# Patient Record
Sex: Male | Born: 1980
Health system: Southern US, Community
[De-identification: ages and names within clinical notes are randomized; demographics above are authoritative.]

## PROBLEM LIST (undated history)

## (undated) DIAGNOSIS — I10 Essential (primary) hypertension: Secondary | ICD-10-CM

## (undated) DIAGNOSIS — E079 Disorder of thyroid, unspecified: Secondary | ICD-10-CM

## (undated) HISTORY — PX: APPENDECTOMY: SHX54

---

## 2002-01-07 ENCOUNTER — Emergency Department (HOSPITAL_COMMUNITY): Admission: EM | Admit: 2002-01-07 | Discharge: 2002-01-07 | Payer: Self-pay | Admitting: *Deleted

## 2003-06-29 ENCOUNTER — Emergency Department (HOSPITAL_COMMUNITY): Admission: EM | Admit: 2003-06-29 | Discharge: 2003-06-29 | Payer: Self-pay | Admitting: Internal Medicine

## 2004-11-03 ENCOUNTER — Emergency Department (HOSPITAL_COMMUNITY): Admission: EM | Admit: 2004-11-03 | Discharge: 2004-11-03 | Payer: Self-pay | Admitting: *Deleted

## 2004-11-04 ENCOUNTER — Emergency Department (HOSPITAL_COMMUNITY): Admission: EM | Admit: 2004-11-04 | Discharge: 2004-11-04 | Payer: Self-pay | Admitting: Emergency Medicine

## 2005-01-25 ENCOUNTER — Emergency Department (HOSPITAL_COMMUNITY): Admission: EM | Admit: 2005-01-25 | Discharge: 2005-01-25 | Payer: Self-pay | Admitting: Emergency Medicine

## 2006-11-03 ENCOUNTER — Emergency Department (HOSPITAL_COMMUNITY): Admission: EM | Admit: 2006-11-03 | Discharge: 2006-11-04 | Payer: Self-pay | Admitting: Emergency Medicine

## 2006-11-14 ENCOUNTER — Emergency Department: Payer: Self-pay | Admitting: Internal Medicine

## 2007-05-16 ENCOUNTER — Emergency Department (HOSPITAL_COMMUNITY): Admission: EM | Admit: 2007-05-16 | Discharge: 2007-05-16 | Payer: Self-pay | Admitting: Emergency Medicine

## 2008-06-22 ENCOUNTER — Emergency Department (HOSPITAL_COMMUNITY): Admission: EM | Admit: 2008-06-22 | Discharge: 2008-06-22 | Payer: Self-pay | Admitting: Emergency Medicine

## 2009-02-14 ENCOUNTER — Ambulatory Visit: Payer: Self-pay | Admitting: Cardiology

## 2009-09-18 ENCOUNTER — Emergency Department (HOSPITAL_COMMUNITY): Admission: EM | Admit: 2009-09-18 | Discharge: 2009-09-18 | Payer: Self-pay | Admitting: Emergency Medicine

## 2009-11-01 ENCOUNTER — Ambulatory Visit (HOSPITAL_COMMUNITY): Admission: RE | Admit: 2009-11-01 | Discharge: 2009-11-01 | Payer: Self-pay | Admitting: Preventative Medicine

## 2010-07-14 IMAGING — CR DG FOOT COMPLETE 3+V*R*
4 series · 4 of 4 positions shown · non-contrast
Comparison: None

CLINICAL DATA: Right foot injury, pain.

RIGHT FOOT COMPLETE - 3+ VIEW

[view not recorded (1 of 4)]
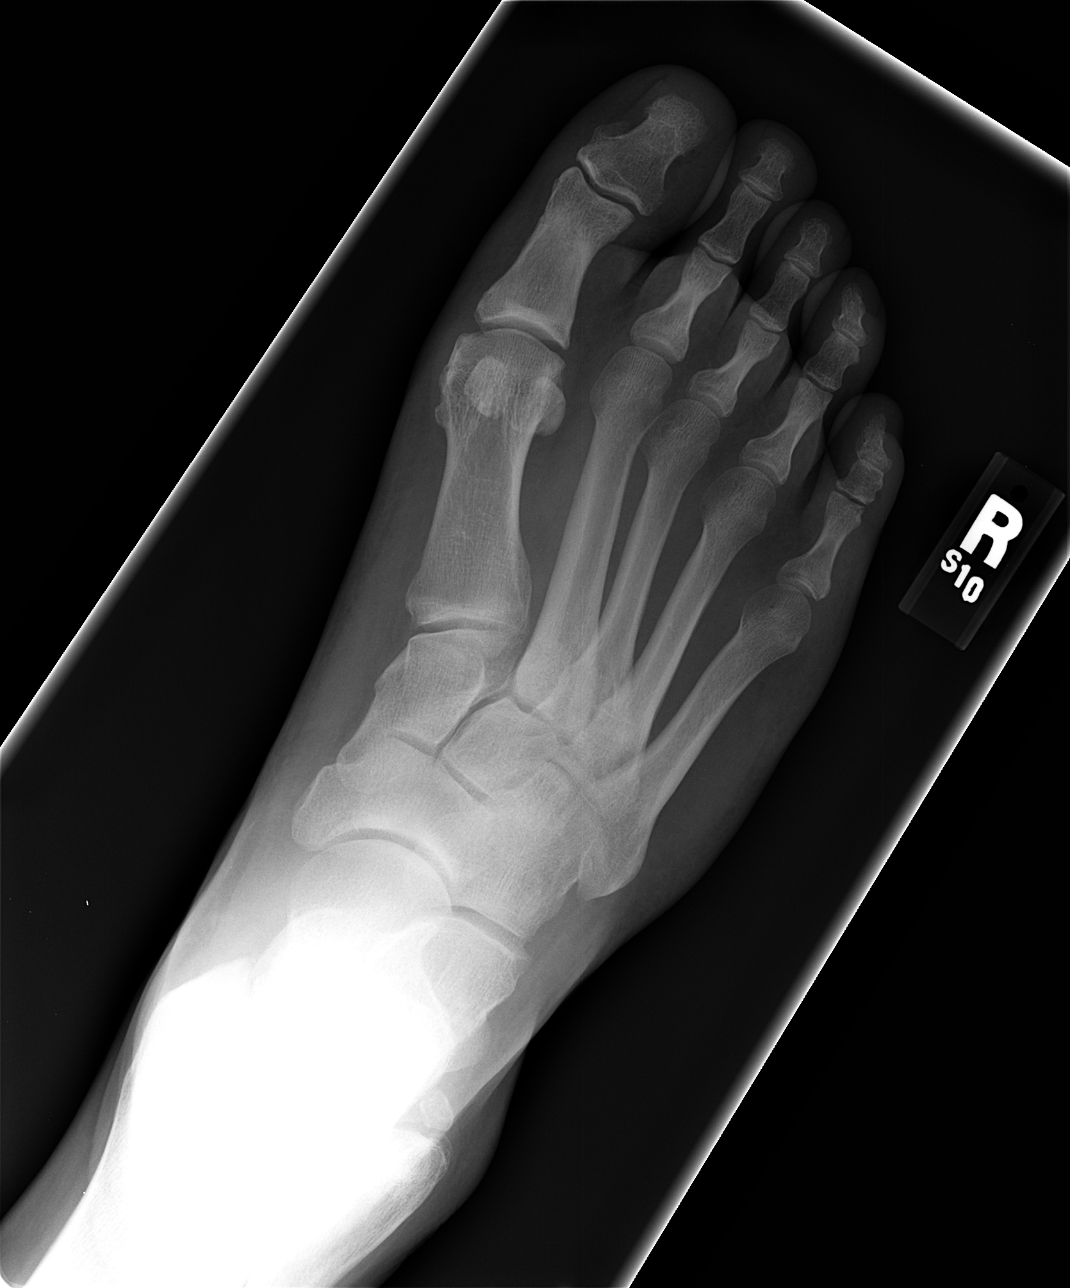

[view not recorded (2 of 4)]
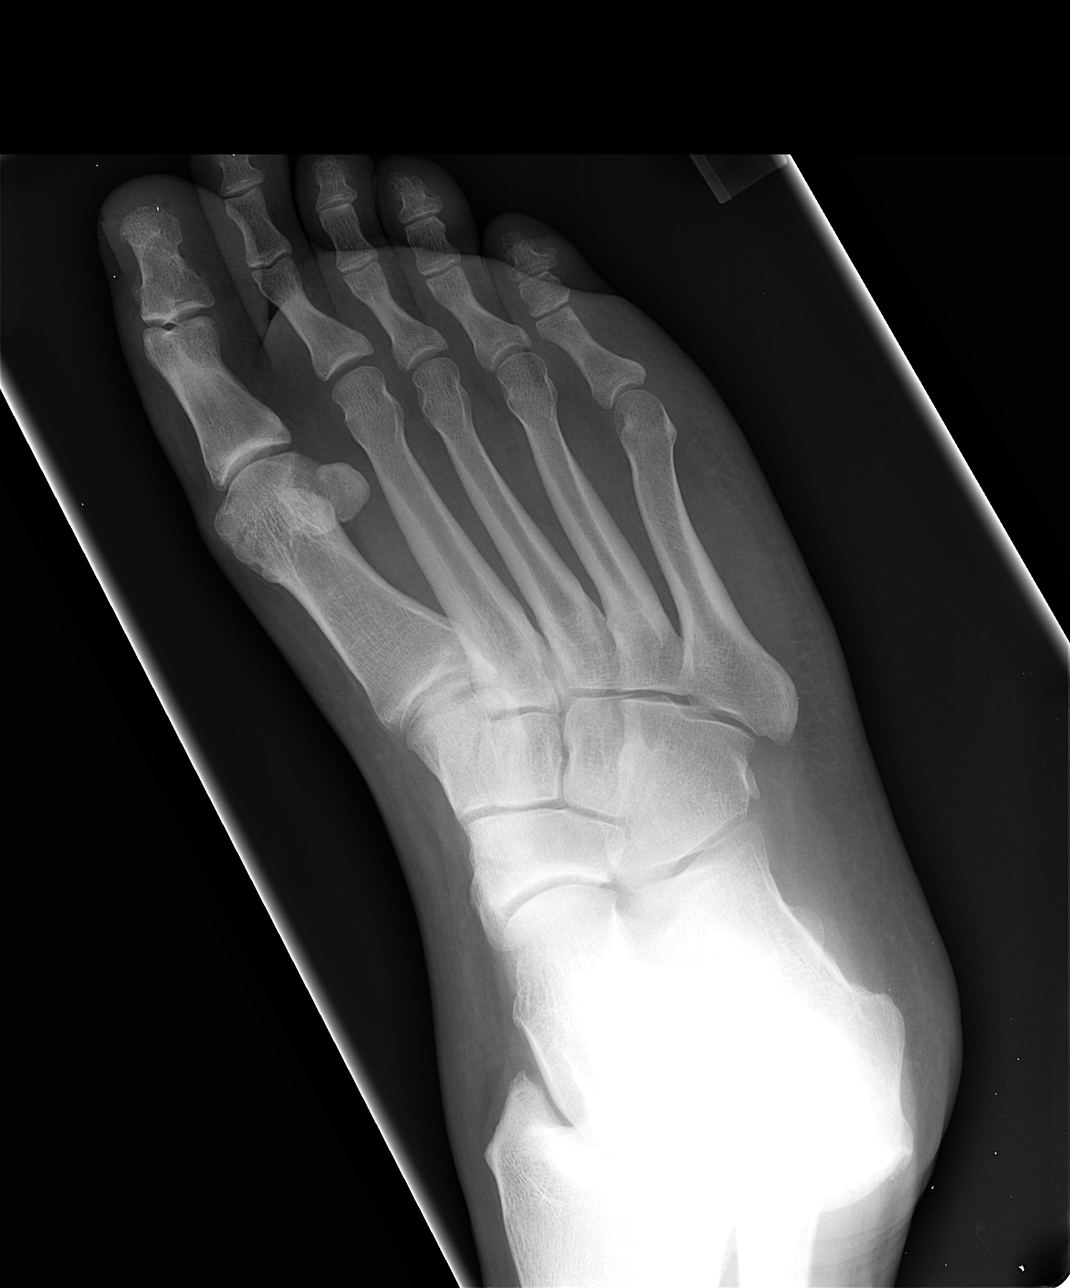

[view not recorded (3 of 4)]
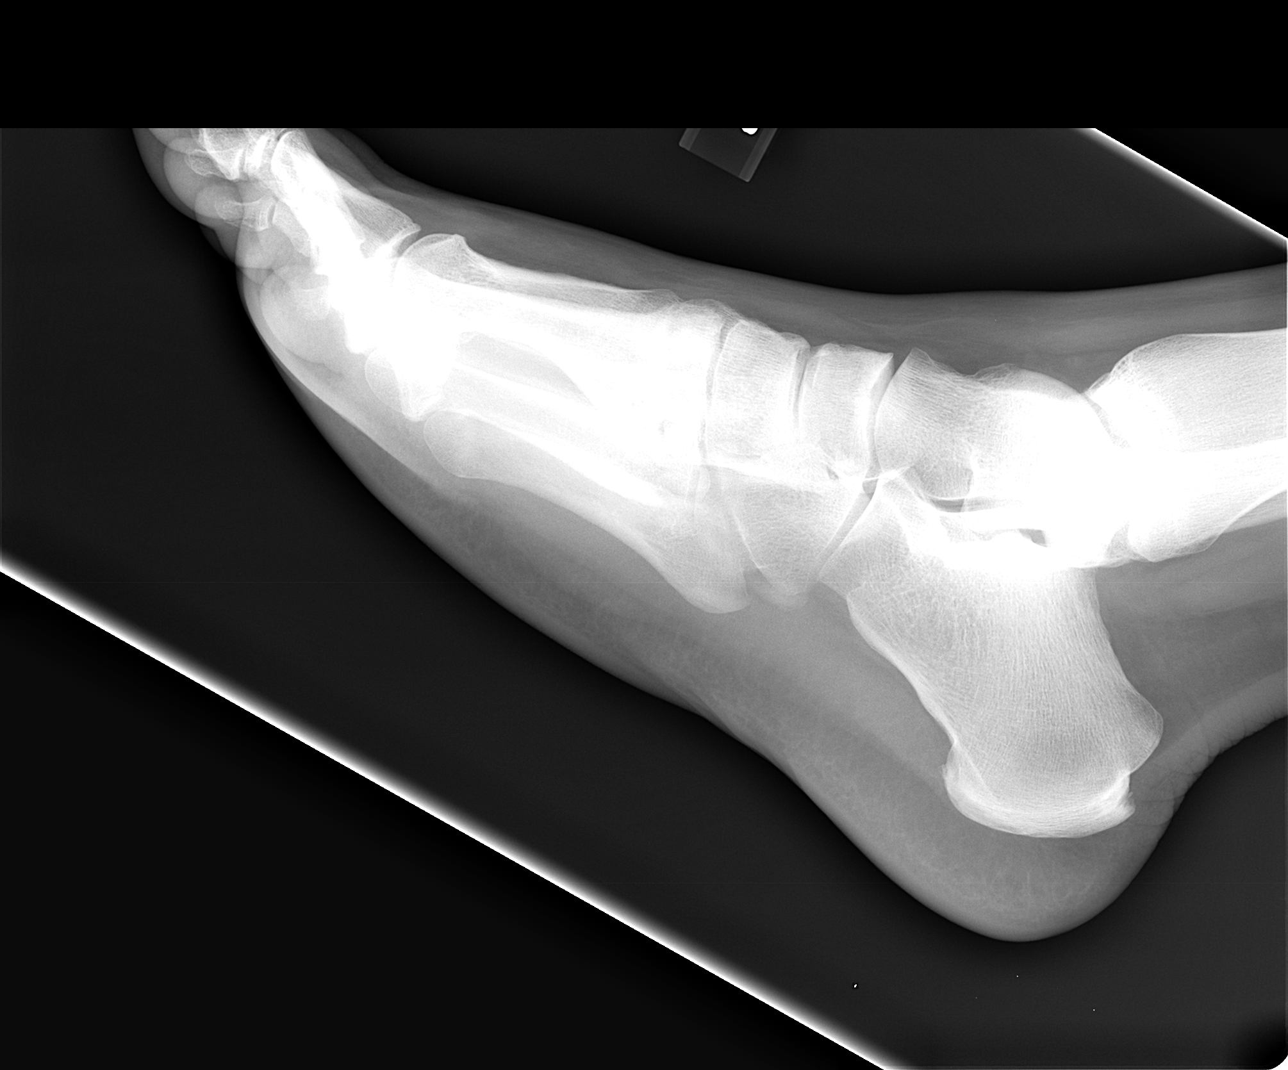

[view not recorded (4 of 4)]
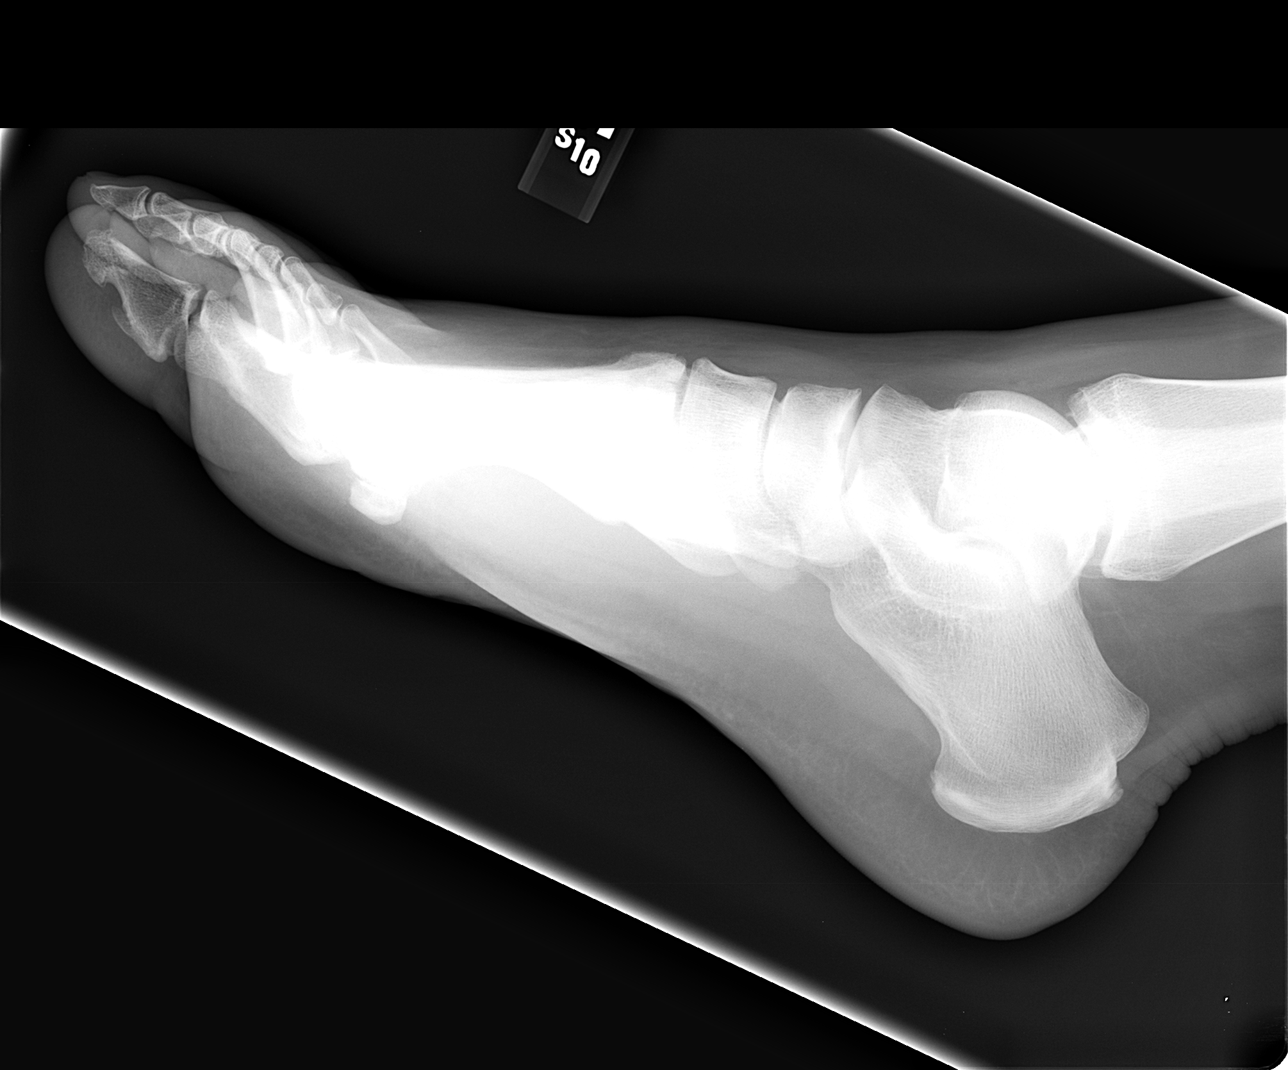

[4 of 4 positions shown; findings below may reference images not displayed]

FINDINGS: No acute bony abnormality.  Specifically, no fracture,
subluxation, or dislocation.  Soft tissues are intact.
IMPRESSION: No acute bony abnormality.

## 2012-10-25 ENCOUNTER — Encounter (HOSPITAL_COMMUNITY): Payer: Self-pay | Admitting: Emergency Medicine

## 2012-10-25 ENCOUNTER — Emergency Department (HOSPITAL_COMMUNITY)
Admission: EM | Admit: 2012-10-25 | Discharge: 2012-10-25 | Disposition: A | Discharge status: Home / Self Care | Payer: Medicaid Other | Attending: Emergency Medicine | Admitting: Emergency Medicine

## 2012-10-25 DIAGNOSIS — K0889 Other specified disorders of teeth and supporting structures: Secondary | ICD-10-CM

## 2012-10-25 DIAGNOSIS — K089 Disorder of teeth and supporting structures, unspecified: Secondary | ICD-10-CM | POA: Insufficient documentation

## 2012-10-25 DIAGNOSIS — I10 Essential (primary) hypertension: Secondary | ICD-10-CM | POA: Insufficient documentation

## 2012-10-25 DIAGNOSIS — F17200 Nicotine dependence, unspecified, uncomplicated: Secondary | ICD-10-CM | POA: Insufficient documentation

## 2012-10-25 HISTORY — DX: Essential (primary) hypertension: I10

## 2012-10-25 MED ORDER — AMOXICILLIN 500 MG PO CAPS: 500.0000 mg | ORAL_CAPSULE | Freq: Three times a day (TID) | ORAL | Status: AC

## 2012-10-25 MED ORDER — TRAMADOL HCL 50 MG PO TABS
50.0000 mg | ORAL_TABLET | Freq: Four times a day (QID) | ORAL | Status: AC | PRN
Start: 2012-10-25 — End: ?

## 2012-10-25 NOTE — ED Provider Notes (Signed)
History     CSN: [id number]  Arrival date & time 10/25/12  1522   First MD Initiated Contact with Patient 10/25/12 1601      Chief Complaint  Patient presents with  . Dental Pain    (Consider location/radiation/quality/duration/timing/severity/associated sxs/prior treatment) Patient is a 32 y.o. male presenting with tooth pain. The history is provided by the patient.  Dental PainThe primary symptoms include mouth pain. Primary symptoms do not include dental injury, oral bleeding, headaches, fever, shortness of breath, sore throat, angioedema or cough. The symptoms began 2 days ago. The symptoms are worsening. The symptoms are new. The symptoms occur constantly.  Mouth pain occurs constantly. Mouth pain is worsening. Affected locations include: teeth and gum(s).  Additional symptoms include: dental sensitivity to temperature and gum tenderness. Additional symptoms do not include: gum swelling, purulent gums, trismus, jaw pain, facial swelling, trouble swallowing and ear pain. Medical issues include: smoking and periodontal disease.    Past Medical History  Diagnosis Date  . Hypertension     Past Surgical History  Procedure Date  . Appendectomy     History reviewed. No pertinent family history.  History  Substance Use Topics  . Smoking status: Current Every Day Smoker -- 0.5 packs/day for 15 years    Types: Cigarettes  . Smokeless tobacco: Never Used  . Alcohol Use: No      Review of Systems  Constitutional: Negative for fever and appetite change.  HENT: Positive for dental problem. Negative for ear pain, congestion, sore throat, facial swelling, trouble swallowing, neck pain and neck stiffness.   Eyes: Negative for pain and visual disturbance.  Respiratory: Negative for cough and shortness of breath.   Neurological: Negative for dizziness, facial asymmetry and headaches.  Hematological: Negative for adenopathy.  All other systems reviewed and are  negative.    Allergies  Review of patient's allergies indicates no known allergies.  Home Medications  No current outpatient prescriptions on file.  BP 156/97  Pulse 90  Temp 97.8 F (36.6 C) (Oral)  Resp 18  Ht 6\' 4"  (1.93 m)  Wt 375 lb (170.099 kg)  BMI 45.65 kg/m2  SpO2 97%  Physical Exam  Nursing note and vitals reviewed. Constitutional: He is oriented to person, place, and time. He appears well-developed and well-nourished. No distress.  HENT:  Head: Normocephalic and atraumatic. No trismus in the jaw.  Right Ear: Tympanic membrane and ear canal normal.  Left Ear: Tympanic membrane and ear canal normal.  Mouth/Throat: Uvula is midline, oropharynx is clear and moist and mucous membranes are normal. Dental caries present. No dental abscesses or uvula swelling.       ttp of the upper right first and second molar.  No facial swelling, trismus or obvious dental abscess  Neck: Normal range of motion. Neck supple.  Cardiovascular: Normal rate, regular rhythm and normal heart sounds.   No murmur heard. Pulmonary/Chest: Effort normal and breath sounds normal.  Musculoskeletal: Normal range of motion.  Lymphadenopathy:    He has no cervical adenopathy.  Neurological: He is alert and oriented to person, place, and time. He exhibits normal muscle tone. Coordination normal.  Skin: Skin is warm and dry.    ED Course  Procedures (including critical care time)  Labs Reviewed - No data to display No results found.      MDM   ttp of the upper right first and second molar.  Patient has a dental appt for later this week.  Agrees to f/u with his  dentist.    Prescribed: Amoxil Ultram  #15       Shan Padgett L. Amberleigh Gerken, [place] 10/25/12 1707

## 2012-10-25 NOTE — ED Notes (Addendum)
Patient c/o right upper dental pain (broken tooth). Patient unable to see dentist Thursday morning. Patient reports taking ibuprofen with no relief. Reports last taking ibuprofen 3 hours ago. Requesting antibiotics so that when he goes to dentist they can pull it.

## 2012-10-26 NOTE — ED Provider Notes (Signed)
Medical screening examination/treatment/procedure(s) were performed by non-physician practitioner and as supervising physician I was immediately available for consultation/collaboration.  Raeford Razor, MD 10/26/12 1450

## 2014-11-03 ENCOUNTER — Encounter (HOSPITAL_COMMUNITY): Payer: Self-pay | Admitting: *Deleted

## 2014-11-03 ENCOUNTER — Emergency Department (HOSPITAL_COMMUNITY)
Admission: EM | Admit: 2014-11-03 | Discharge: 2014-11-03 | Disposition: A | Discharge status: Home / Self Care | Payer: Medicaid Other | Attending: Emergency Medicine | Admitting: Emergency Medicine

## 2014-11-03 DIAGNOSIS — Y9389 Activity, other specified: Secondary | ICD-10-CM | POA: Diagnosis not present

## 2014-11-03 DIAGNOSIS — S39012A Strain of muscle, fascia and tendon of lower back, initial encounter: Secondary | ICD-10-CM | POA: Insufficient documentation

## 2014-11-03 DIAGNOSIS — Y92410 Unspecified street and highway as the place of occurrence of the external cause: Secondary | ICD-10-CM | POA: Diagnosis not present

## 2014-11-03 DIAGNOSIS — S3991XA Unspecified injury of abdomen, initial encounter: Secondary | ICD-10-CM | POA: Insufficient documentation

## 2014-11-03 DIAGNOSIS — S3992XA Unspecified injury of lower back, initial encounter: Secondary | ICD-10-CM | POA: Diagnosis present

## 2014-11-03 DIAGNOSIS — Z792 Long term (current) use of antibiotics: Secondary | ICD-10-CM | POA: Insufficient documentation

## 2014-11-03 DIAGNOSIS — Y998 Other external cause status: Secondary | ICD-10-CM | POA: Diagnosis not present

## 2014-11-03 DIAGNOSIS — I10 Essential (primary) hypertension: Secondary | ICD-10-CM | POA: Insufficient documentation

## 2014-11-03 DIAGNOSIS — Z720 Tobacco use: Secondary | ICD-10-CM | POA: Diagnosis not present

## 2014-11-03 DIAGNOSIS — Z8639 Personal history of other endocrine, nutritional and metabolic disease: Secondary | ICD-10-CM | POA: Insufficient documentation

## 2014-11-03 HISTORY — DX: Disorder of thyroid, unspecified: E07.9

## 2014-11-03 MED ORDER — CELECOXIB 100 MG PO CAPS: 100.0000 mg | ORAL_CAPSULE | Freq: Two times a day (BID) | ORAL | Status: AC

## 2014-11-03 MED ORDER — KETOROLAC TROMETHAMINE 10 MG PO TABS
10.0000 mg | ORAL_TABLET | Freq: Once | ORAL | Status: AC
  Administered 2014-11-03: 10 mg via ORAL
  Filled 2014-11-03: qty 1

## 2014-11-03 MED ORDER — METHOCARBAMOL 500 MG PO TABS: 1000.0000 mg | ORAL_TABLET | Freq: Three times a day (TID) | ORAL | Status: AC
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
## 2014-11-03 NOTE — ED Notes (Signed)
Patient with no complaints at this time. Respirations even and unlabored. Skin warm/dry. Discharge instructions reviewed with patient at this time. Patient given opportunity to voice concerns/ask questions. Patient discharged at this time and left Emergency Department with steady gait.   

## 2014-11-03 NOTE — Discharge Instructions (Signed)

## 2015-04-10 DIAGNOSIS — E782 Mixed hyperlipidemia: Secondary | ICD-10-CM | POA: Diagnosis not present

## 2015-04-10 DIAGNOSIS — K219 Gastro-esophageal reflux disease without esophagitis: Secondary | ICD-10-CM | POA: Diagnosis not present

## 2015-04-10 DIAGNOSIS — M545 Low back pain: Secondary | ICD-10-CM | POA: Diagnosis not present

## 2015-04-10 DIAGNOSIS — R079 Chest pain, unspecified: Secondary | ICD-10-CM | POA: Diagnosis not present

## 2015-04-10 DIAGNOSIS — F329 Major depressive disorder, single episode, unspecified: Secondary | ICD-10-CM | POA: Diagnosis not present

## 2015-04-10 DIAGNOSIS — E1165 Type 2 diabetes mellitus with hyperglycemia: Secondary | ICD-10-CM | POA: Diagnosis not present

## 2015-04-10 DIAGNOSIS — E039 Hypothyroidism, unspecified: Secondary | ICD-10-CM | POA: Diagnosis not present

## 2015-04-10 DIAGNOSIS — Z720 Tobacco use: Secondary | ICD-10-CM | POA: Diagnosis not present

## 2015-04-10 DIAGNOSIS — I10 Essential (primary) hypertension: Secondary | ICD-10-CM | POA: Diagnosis not present

## 2015-07-17 DIAGNOSIS — F329 Major depressive disorder, single episode, unspecified: Secondary | ICD-10-CM | POA: Diagnosis not present

## 2015-07-17 DIAGNOSIS — M545 Low back pain: Secondary | ICD-10-CM | POA: Diagnosis not present

## 2015-07-17 DIAGNOSIS — Z720 Tobacco use: Secondary | ICD-10-CM | POA: Diagnosis not present

## 2015-07-17 DIAGNOSIS — K5900 Constipation, unspecified: Secondary | ICD-10-CM | POA: Diagnosis not present

## 2015-07-17 DIAGNOSIS — I10 Essential (primary) hypertension: Secondary | ICD-10-CM | POA: Diagnosis not present

## 2015-07-17 DIAGNOSIS — E782 Mixed hyperlipidemia: Secondary | ICD-10-CM | POA: Diagnosis not present

## 2015-07-17 DIAGNOSIS — R079 Chest pain, unspecified: Secondary | ICD-10-CM | POA: Diagnosis not present

## 2015-07-17 DIAGNOSIS — E039 Hypothyroidism, unspecified: Secondary | ICD-10-CM | POA: Diagnosis not present

## 2015-07-17 DIAGNOSIS — E1165 Type 2 diabetes mellitus with hyperglycemia: Secondary | ICD-10-CM | POA: Diagnosis not present

## 2015-07-17 DIAGNOSIS — K219 Gastro-esophageal reflux disease without esophagitis: Secondary | ICD-10-CM | POA: Diagnosis not present

## 2015-10-08 DIAGNOSIS — E1165 Type 2 diabetes mellitus with hyperglycemia: Secondary | ICD-10-CM | POA: Diagnosis not present

## 2015-10-08 DIAGNOSIS — F329 Major depressive disorder, single episode, unspecified: Secondary | ICD-10-CM | POA: Diagnosis not present

## 2015-10-08 DIAGNOSIS — E039 Hypothyroidism, unspecified: Secondary | ICD-10-CM | POA: Diagnosis not present

## 2015-10-08 DIAGNOSIS — M545 Low back pain: Secondary | ICD-10-CM | POA: Diagnosis not present

## 2015-10-08 DIAGNOSIS — E782 Mixed hyperlipidemia: Secondary | ICD-10-CM | POA: Diagnosis not present

## 2015-10-08 DIAGNOSIS — K219 Gastro-esophageal reflux disease without esophagitis: Secondary | ICD-10-CM | POA: Diagnosis not present

## 2015-10-08 DIAGNOSIS — I10 Essential (primary) hypertension: Secondary | ICD-10-CM | POA: Diagnosis not present

## 2016-01-08 DIAGNOSIS — E1165 Type 2 diabetes mellitus with hyperglycemia: Secondary | ICD-10-CM | POA: Diagnosis not present

## 2016-01-08 DIAGNOSIS — E782 Mixed hyperlipidemia: Secondary | ICD-10-CM | POA: Diagnosis not present

## 2016-01-08 DIAGNOSIS — E039 Hypothyroidism, unspecified: Secondary | ICD-10-CM | POA: Diagnosis not present

## 2016-01-08 DIAGNOSIS — K219 Gastro-esophageal reflux disease without esophagitis: Secondary | ICD-10-CM | POA: Diagnosis not present

## 2016-01-08 DIAGNOSIS — F329 Major depressive disorder, single episode, unspecified: Secondary | ICD-10-CM | POA: Diagnosis not present

## 2016-01-08 DIAGNOSIS — M545 Low back pain: Secondary | ICD-10-CM | POA: Diagnosis not present

## 2016-01-08 DIAGNOSIS — Z1389 Encounter for screening for other disorder: Secondary | ICD-10-CM | POA: Diagnosis not present

## 2016-01-08 DIAGNOSIS — I10 Essential (primary) hypertension: Secondary | ICD-10-CM | POA: Diagnosis not present

## 2016-04-01 DIAGNOSIS — E039 Hypothyroidism, unspecified: Secondary | ICD-10-CM | POA: Diagnosis not present

## 2016-04-01 DIAGNOSIS — E1165 Type 2 diabetes mellitus with hyperglycemia: Secondary | ICD-10-CM | POA: Diagnosis not present

## 2016-04-01 DIAGNOSIS — Z6841 Body Mass Index (BMI) 40.0 and over, adult: Secondary | ICD-10-CM | POA: Diagnosis not present

## 2016-04-01 DIAGNOSIS — E782 Mixed hyperlipidemia: Secondary | ICD-10-CM | POA: Diagnosis not present

## 2016-04-01 DIAGNOSIS — M545 Low back pain: Secondary | ICD-10-CM | POA: Diagnosis not present

## 2016-04-01 DIAGNOSIS — F329 Major depressive disorder, single episode, unspecified: Secondary | ICD-10-CM | POA: Diagnosis not present

## 2016-04-01 DIAGNOSIS — K219 Gastro-esophageal reflux disease without esophagitis: Secondary | ICD-10-CM | POA: Diagnosis not present

## 2016-04-01 DIAGNOSIS — I10 Essential (primary) hypertension: Secondary | ICD-10-CM | POA: Diagnosis not present

## 2016-06-25 DIAGNOSIS — K219 Gastro-esophageal reflux disease without esophagitis: Secondary | ICD-10-CM | POA: Diagnosis not present

## 2016-06-25 DIAGNOSIS — F329 Major depressive disorder, single episode, unspecified: Secondary | ICD-10-CM | POA: Diagnosis not present

## 2016-06-25 DIAGNOSIS — E039 Hypothyroidism, unspecified: Secondary | ICD-10-CM | POA: Diagnosis not present

## 2016-06-25 DIAGNOSIS — G4730 Sleep apnea, unspecified: Secondary | ICD-10-CM | POA: Diagnosis not present

## 2016-06-25 DIAGNOSIS — E782 Mixed hyperlipidemia: Secondary | ICD-10-CM | POA: Diagnosis not present

## 2016-06-25 DIAGNOSIS — E1143 Type 2 diabetes mellitus with diabetic autonomic (poly)neuropathy: Secondary | ICD-10-CM | POA: Diagnosis not present

## 2016-06-25 DIAGNOSIS — I10 Essential (primary) hypertension: Secondary | ICD-10-CM | POA: Diagnosis not present

## 2016-10-06 DIAGNOSIS — E1143 Type 2 diabetes mellitus with diabetic autonomic (poly)neuropathy: Secondary | ICD-10-CM | POA: Diagnosis not present

## 2016-10-06 DIAGNOSIS — E039 Hypothyroidism, unspecified: Secondary | ICD-10-CM | POA: Diagnosis not present

## 2016-10-06 DIAGNOSIS — F329 Major depressive disorder, single episode, unspecified: Secondary | ICD-10-CM | POA: Diagnosis not present

## 2016-10-06 DIAGNOSIS — E782 Mixed hyperlipidemia: Secondary | ICD-10-CM | POA: Diagnosis not present

## 2016-10-06 DIAGNOSIS — K219 Gastro-esophageal reflux disease without esophagitis: Secondary | ICD-10-CM | POA: Diagnosis not present

## 2016-10-06 DIAGNOSIS — G4730 Sleep apnea, unspecified: Secondary | ICD-10-CM | POA: Diagnosis not present

## 2016-10-06 DIAGNOSIS — I10 Essential (primary) hypertension: Secondary | ICD-10-CM | POA: Diagnosis not present

## 2016-10-18 DIAGNOSIS — R404 Transient alteration of awareness: Secondary | ICD-10-CM | POA: Diagnosis not present

## 2016-10-22 DIAGNOSIS — 419620001 Death: Secondary | SNOMED CT | POA: Diagnosis not present

## 2016-10-22 DEATH — deceased
